# Patient Record
Sex: Male | Born: 1990 | Race: Black or African American | Hispanic: No | Marital: Single | Smoking: Never smoker
Health system: Southern US, Community
[De-identification: ages and names within clinical notes are randomized; demographics above are authoritative.]

## PROBLEM LIST (undated history)

## (undated) DIAGNOSIS — J302 Other seasonal allergic rhinitis: Secondary | ICD-10-CM

## (undated) DIAGNOSIS — K469 Unspecified abdominal hernia without obstruction or gangrene: Secondary | ICD-10-CM

## (undated) DIAGNOSIS — J189 Pneumonia, unspecified organism: Secondary | ICD-10-CM

---
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
## 2017-07-11 ENCOUNTER — Emergency Department (HOSPITAL_COMMUNITY): Payer: Self-pay

## 2017-07-11 ENCOUNTER — Emergency Department (HOSPITAL_COMMUNITY)
Admission: EM | Admit: 2017-07-11 | Discharge: 2017-07-11 | Disposition: A | Discharge status: Home / Self Care | Payer: Self-pay | Attending: Emergency Medicine | Admitting: Emergency Medicine

## 2017-07-11 ENCOUNTER — Encounter (HOSPITAL_COMMUNITY): Payer: Self-pay | Admitting: *Deleted

## 2017-07-11 ENCOUNTER — Other Ambulatory Visit: Payer: Self-pay

## 2017-07-11 DIAGNOSIS — S61213A Laceration without foreign body of left middle finger without damage to nail, initial encounter: Secondary | ICD-10-CM | POA: Insufficient documentation

## 2017-07-11 DIAGNOSIS — Y929 Unspecified place or not applicable: Secondary | ICD-10-CM | POA: Insufficient documentation

## 2017-07-11 DIAGNOSIS — Y939 Activity, unspecified: Secondary | ICD-10-CM | POA: Insufficient documentation

## 2017-07-11 DIAGNOSIS — S61215A Laceration without foreign body of left ring finger without damage to nail, initial encounter: Secondary | ICD-10-CM | POA: Insufficient documentation

## 2017-07-11 DIAGNOSIS — Y999 Unspecified external cause status: Secondary | ICD-10-CM | POA: Insufficient documentation

## 2017-07-11 DIAGNOSIS — W25XXXA Contact with sharp glass, initial encounter: Secondary | ICD-10-CM | POA: Insufficient documentation

## 2017-07-11 DIAGNOSIS — Z23 Encounter for immunization: Secondary | ICD-10-CM | POA: Insufficient documentation

## 2017-07-11 DIAGNOSIS — S61315A Laceration without foreign body of left ring finger with damage to nail, initial encounter: Secondary | ICD-10-CM

## 2017-07-11 MED ORDER — LIDOCAINE HCL (PF) 2 % IJ SOLN
INTRAMUSCULAR | Status: AC
  Filled 2017-07-11: qty 20

## 2017-07-11 MED ORDER — TETANUS-DIPHTH-ACELL PERTUSSIS 5-2.5-18.5 LF-MCG/0.5 IM SUSP
0.5000 mL | Freq: Once | INTRAMUSCULAR | Status: AC
  Administered 2017-07-11: 0.5 mL via INTRAMUSCULAR
  Filled 2017-07-11: qty 0.5

## 2017-07-11 MED ORDER — LIDOCAINE HCL (PF) 2 % IJ SOLN
INTRAMUSCULAR | Status: AC
  Filled 2017-07-11: qty 10

## 2017-07-11 NOTE — ED Triage Notes (Signed)
Pt c/o laceration to middle and ring finger on left hand today from glass of a window, bleeding controlled

## 2017-07-11 NOTE — Discharge Instructions (Addendum)
Keep the finger clean with mild soap and water and keep it bandaged.  Ibuprofen if needed for pain.  Sutures out in 8-10 days.  Call Dr. Romeo AppleHarrison to arrange a follow-up appt if needed.

## 2017-07-11 NOTE — ED Provider Notes (Signed)
  [facility] EMERGENCY DEPARTMENT Provider Note   CSN: [id number] Arrival date & time: 07/11/17  2052     History   Chief Complaint Chief Complaint  Patient presents with  . Laceration    HPI Adam Byrd is a 26 y.o. male.  HPI  Patient presents today complaining of laceration to left third and fourth fingers after putting his hand through a window approximately 6 hours ago.  He denies any other injuries.  He does not know when he had his last tetanus shot.  He denies any other medical problems.  There is no numbness or tingling present.  History reviewed. No pertinent past medical history.  There are no active problems to display for this patient.   History reviewed. No pertinent surgical history.     Home Medications    Prior to Admission medications   Not on File    Family History No family history on file.  Social History Social History   Tobacco Use  . Smoking status: Never Smoker  . Smokeless tobacco: Never Used  Substance Use Topics  . Alcohol use: No    Frequency: Never  . Drug use: No     Allergies   Patient has no known allergies.   Review of Systems Review of Systems  All other systems reviewed and are negative.    Physical Exam Updated Vital Signs BP 121/73   Pulse 78   Temp 98.9 F (37.2 C) (Oral)   Resp 16   Ht 1.6 m (5\' 3" )   Wt 78.5 kg (173 lb)   SpO2 98%   BMI 30.65 kg/m   Physical Exam  Constitutional: He is oriented to person, place, and time. He appears well-developed and well-nourished.  HENT:  Head: Normocephalic and atraumatic.  Musculoskeletal: Normal range of motion.       Hands: 2 cm laceration through the dorsal aspect distal left third digit through the nail .5 cm laceration dorsum left third finger over dip joint Small less than 0.25 laceration dorsal aspect fourth finger over PIP joint Full arom all fingers with 2 point sensation intact  Neurological: He is alert and oriented to person, place,  and time.  Skin: Skin is warm. Capillary refill takes less than 2 seconds.  Psychiatric: He has a normal mood and affect. His behavior is normal.  Nursing note and vitals reviewed.    ED Treatments / Results  Labs (all labs ordered are listed, but only abnormal results are displayed) Labs Reviewed - No data to display  EKG  EKG Interpretation None       Radiology No results found.  Procedures Procedures (including critical care time)  Medications Ordered in ED Medications  lidocaine (XYLOCAINE) 2 % injection (not administered)     Initial Impression / Assessment and Plan / ED Course  I have reviewed the triage vital signs and the nursing notes.  Pertinent labs & imaging results that were available during my care of the patient were reviewed by me and considered in my medical decision making (see chart for details).     Please see repair note per Pauline Ausammy Triplett, PA C  Final Clinical Impressions(s) / ED Diagnoses   Final diagnoses:  Laceration of left ring finger without foreign body with damage to nail, initial encounter    ED Discharge Orders    None       Margarita Grizzleay, Lawson Isabell, MD 07/11/17 2138

## 2017-07-11 NOTE — ED Provider Notes (Signed)
   patient with two lacerations to left middle finger including a nail bed laceration to the distal third of the nail that occurred earlier today.  Bleeding controlled. NV intact and has FROM of the digit.    I was asked by my the attending to repair the lacerations.     LACERATION REPAIR Performed by: Ziomara Birenbaum L. Authorized by: Maxwell CaulRIPLETT,Lanna Labella L. Consent: Verbal consent obtained. Risks and benefits: risks, benefits and alternatives were discussed Consent given by: patient Patient identity confirmed: provided demographic data Prepped and Draped in normal sterile fashion Wound explored  Laceration Location: left distal finger  Laceration Length: 2 cm through the nail bed  No Foreign Bodies seen or palpated  Anesthesia: digital block--- performed by Dr. Rosalia Hammersay  Local anesthetic: lidocaine 2 % w/o epinephrine  Anesthetic total: 2 ml  Irrigation method: syringe Amount of cleaning: standard  Skin closure: 4-0 ethilon  Number of sutures: one interrupted suture through the nail to stabilize, and 3 interrupted sutures to the eponychium  Technique: simple interrupted  Patient tolerance: Patient tolerated the procedure well with no immediate complications.   TD updated.  Finger splinted and bandaged.  Wound care instructions discussed. Agrees to suture removal in 8-10 days.  Return precautions discussed.    Pauline Ausriplett, Renesmay Nesbitt, PA-C 07/11/17 2323    Margarita Grizzleay, Danielle, MD 07/15/17 [phone]

## 2018-10-05 ENCOUNTER — Other Ambulatory Visit: Payer: Self-pay

## 2018-10-05 ENCOUNTER — Emergency Department (HOSPITAL_COMMUNITY)
Admission: EM | Admit: 2018-10-05 | Discharge: 2018-10-05 | Disposition: A | Discharge status: Home / Self Care | Payer: 59 | Attending: Emergency Medicine | Admitting: Emergency Medicine

## 2018-10-05 ENCOUNTER — Emergency Department (HOSPITAL_COMMUNITY): Payer: 59

## 2018-10-05 ENCOUNTER — Encounter (HOSPITAL_COMMUNITY): Payer: Self-pay | Admitting: Emergency Medicine

## 2018-10-05 DIAGNOSIS — R509 Fever, unspecified: Secondary | ICD-10-CM | POA: Diagnosis not present

## 2018-10-05 DIAGNOSIS — R05 Cough: Secondary | ICD-10-CM | POA: Insufficient documentation

## 2018-10-05 DIAGNOSIS — R059 Cough, unspecified: Secondary | ICD-10-CM

## 2018-10-05 LAB — INFLUENZA PANEL BY PCR (TYPE A & B)
Influenza A By PCR: NEGATIVE
Influenza B By PCR: NEGATIVE

## 2018-10-05 MED ORDER — AZITHROMYCIN 250 MG PO TABS: 250.0000 mg | ORAL_TABLET | Freq: Every day | ORAL | 0 refills | Status: DC

## 2018-10-05 MED ORDER — BENZONATATE 100 MG PO CAPS: 100.0000 mg | ORAL_CAPSULE | Freq: Three times a day (TID) | ORAL | 0 refills | Status: DC

## 2018-10-05 MED ORDER — HYDROCOD POLST-CPM POLST ER 10-8 MG/5ML PO SUER
5.0000 mL | Freq: Once | ORAL | Status: AC
  Administered 2018-10-05: 5 mL via ORAL
  Filled 2018-10-05: qty 5

## 2018-10-05 NOTE — ED Notes (Signed)
PT states he will take ibuprofen when he leaves the ED.

## 2018-10-05 NOTE — ED Provider Notes (Signed)
[facility] EMERGENCY DEPARTMENT Provider Note   CSN: [id number] Arrival date & time: 10/05/18  1040     History   Chief Complaint Chief Complaint  Patient presents with  . Influenza    HPI Adam Byrd is a 28 y.o. male into emergency department with chief complaint of cough, fever, headache x1 week.  Patient reports the cough is productive with yellow sputum with associated nasal congestion. He has had 1 episode of posttussive emesis.  He has been taking Tylenol without symptom relief.  His T-max of 101F oral was just prior to arrival. He reports his headache is present after a coughing episode, he currently does not have one. Patient admits multiple sick contacts.  Denies rash, chest pain, shortness of breath, visual changes, neck pain, nausea, diarrhea, urinary symptoms. History reviewed. No pertinent past medical history.  There are no active problems to display for this patient.   History reviewed. No pertinent surgical history.      Home Medications    Prior to Admission medications   Medication Sig Start Date End Date Taking? Authorizing Provider  azithromycin (ZITHROMAX) 250 MG tablet Take 1 tablet (250 mg total) by mouth daily. Take first 2 tablets together, then 1 every day until finished. 10/05/18   Albrizze, Kaitlyn E, PA-C  benzonatate (TESSALON) 100 MG capsule Take 1 capsule (100 mg total) by mouth every 8 (eight) hours. 10/05/18   Albrizze, Caroleen Hamman, PA-C    Family History History reviewed. No pertinent family history.  Social History Social History   Tobacco Use  . Smoking status: Never Smoker  . Smokeless tobacco: Never Used  Substance Use Topics  . Alcohol use: No    Frequency: Never  . Drug use: No     Allergies   Patient has no known allergies.   Review of Systems Review of Systems  Constitutional: Positive for chills and fever.  HENT: Positive for congestion. Negative for ear pain, sinus pain and sore throat.   Respiratory:  Positive for cough. Negative for shortness of breath and wheezing.   Neurological: Positive for headaches. Negative for dizziness.  All other systems reviewed and are negative.    Physical Exam Updated Vital Signs BP 130/62 (BP Location: Right Arm)   Pulse (!) 109   Temp 98.6 F (37 C) (Oral)   Resp 18   Ht 5' 4.5" (1.638 m)   Wt 79.4 kg   SpO2 100%   BMI 29.57 kg/m   Physical Exam Vitals signs and nursing note reviewed.  Constitutional:      Appearance: He is not ill-appearing or toxic-appearing.  HENT:     Head: Normocephalic and atraumatic.     Right Ear: Tympanic membrane normal. There is no impacted cerumen.     Left Ear: Tympanic membrane normal. There is no impacted cerumen.     Nose: Nose normal.     Mouth/Throat:     Mouth: Mucous membranes are moist.     Pharynx: Oropharynx is clear.  Eyes:     General: No scleral icterus.    Conjunctiva/sclera: Conjunctivae normal.  Neck:     Musculoskeletal: Normal range of motion. No muscular tenderness.  Cardiovascular:     Rate and Rhythm: Regular rhythm. Tachycardia present.     Pulses: Normal pulses.     Heart sounds: Normal heart sounds.  Pulmonary:     Effort: Pulmonary effort is normal. No respiratory distress.     Breath sounds: Normal breath sounds. No wheezing, rhonchi or rales.  Abdominal:     General: There is no distension.     Palpations: Abdomen is soft.     Tenderness: There is no abdominal tenderness. There is no guarding or rebound.     Comments: No peritoneal signs.  Musculoskeletal: Normal range of motion.  Skin:    General: Skin is warm and dry.     Capillary Refill: Capillary refill takes less than 2 seconds.  Neurological:     Mental Status: He is alert. Mental status is at baseline.     Motor: No weakness.  Psychiatric:        Behavior: Behavior normal.      ED Treatments / Results  Labs (all labs ordered are listed, but only abnormal results are displayed) Labs Reviewed  INFLUENZA  PANEL BY PCR (TYPE A & B)    EKG None  Radiology Dg Chest 2 View  Result Date: 10/05/2018 CLINICAL DATA:  Hemoptysis. EXAM: CHEST - 2 VIEW COMPARISON:  None. FINDINGS: There is an infiltrate in the left upper lobe. The heart, hila, and mediastinum are normal. No pneumothorax. The lungs are otherwise clear. IMPRESSION: There is infiltrate in the left upper lobe. This could represent pneumonia or aspiration. Recommend treatment with short-term follow-up chest x-ray to ensure resolution. Electronically Signed   By: Gerome Sam III M.D   On: 10/05/2018 12:16    Procedures Procedures (including critical care time)  Medications Ordered in ED Medications  chlorpheniramine-HYDROcodone (TUSSIONEX) 10-8 MG/5ML suspension 5 mL (5 mLs Oral Given 10/05/18 1215)     Initial Impression / Assessment and Plan / ED Course  I have reviewed the triage vital signs and the nursing notes.  Pertinent labs & imaging results that were available during my care of the patient were reviewed by me and considered in my medical decision making (see chart for details).    Patient presents with productive cough, fever, chills x 1 week.  Flu test is negative today.  I viewed his CXR and it shows shows evidence of pneumonia with infiltrate in left upper lobe. Pt afebrile in triage, without  tachypnea or hypotension. His SpO2 is 100% and he is not in respiratory distress.  Will d/c home with  Azithromycin. Conservative therapy indicated. Recommend close follow up with PCP.  Patient is tolerating p.o. fluids, recommend increase fluid intake. Strict return precautions given.   Patient is febrile at discharge.  He declines ibuprofen or Tylenol before leaving, stating he will take some when he gets home. Patient is hemodynamically stable and appears safe for discharge.   Pt case discussed with Dr. Jacqulyn Bath who agrees with my plan.    Final Clinical Impressions(s) / ED Diagnoses   Final diagnoses:  Cough  Fever, unspecified  fever cause    ED Discharge Orders         Ordered    azithromycin (ZITHROMAX) 250 MG tablet  Daily     10/05/18 1301    benzonatate (TESSALON) 100 MG capsule  Every 8 hours     10/05/18 1301           Kathyrn Lass 10/05/18 1955    Maia Plan, MD 10/07/18 [phone]

## 2018-10-05 NOTE — ED Triage Notes (Signed)
Pt c/o of productive cough, chills, fever, headache x 1 week.

## 2018-10-05 NOTE — Discharge Instructions (Signed)
You have been seen today for fever and cough. Please read and follow all provided instructions. Return to the emergency room for worsening condition or new concerning symptoms.     You should follow-up with your primary care provider in 4 weeks, at that time they might repeat your chest x-ray if they feel it is necessary.   1. Medications:  Prescription sent to your pharmacy for Azithromycin. Please take antibiotics as prescribed.  Also sent a prescription for Tessalon. This is a cough medicine you can take as needed.  Continue usual home medications Take medications as prescribed. Please review all of the medicines and only take them if you do not have an allergy to them.  2. Treatment: rest, drink plenty of fluids 3. Follow Up: Please follow up with your primary doctor in 2-5 days for discussion of your diagnoses and further evaluation after today's visit; Call today to arrange your follow up.  If you do not have a primary care doctor use the resource guide provided to find one;   It is also a possibility that you have an allergic reaction to any of the medicines that you have been prescribed - Everybody reacts differently to medications and while MOST people have no trouble with most medicines, you may have a reaction such as nausea, vomiting, rash, swelling, shortness of breath. If this is the case, please stop taking the medicine immediately and contact your physician.  ?

## 2018-10-27 ENCOUNTER — Other Ambulatory Visit: Payer: Self-pay

## 2018-10-27 ENCOUNTER — Emergency Department (HOSPITAL_COMMUNITY)
Admission: EM | Admit: 2018-10-27 | Discharge: 2018-10-28 | Disposition: A | Discharge status: Home / Self Care | Payer: 59 | Attending: Emergency Medicine | Admitting: Emergency Medicine

## 2018-10-27 ENCOUNTER — Encounter (HOSPITAL_COMMUNITY): Payer: Self-pay | Admitting: Emergency Medicine

## 2018-10-27 DIAGNOSIS — K429 Umbilical hernia without obstruction or gangrene: Secondary | ICD-10-CM | POA: Diagnosis not present

## 2018-10-27 DIAGNOSIS — R1033 Periumbilical pain: Secondary | ICD-10-CM | POA: Diagnosis present

## 2018-10-27 HISTORY — DX: Pneumonia, unspecified organism: J18.9

## 2018-10-27 NOTE — ED Triage Notes (Signed)
Pt with pain around umbilicus that started 5 days ago. States worse after eating. Pt's girlfriend believes he has a hernia.

## 2018-10-27 NOTE — ED Provider Notes (Signed)
[facility] EMERGENCY DEPARTMENT Provider Note   CSN: [id number] Arrival date & time: 10/27/18  2132    History   Chief Complaint Chief Complaint  Patient presents with  . Abdominal Pain    HPI Adam Byrd is a 28 y.o. male.      HPI  Adam Byrd is a 28 y.o. male who presents to the Emergency Department complaining of waxing and waning umbilical pain for 4 to 5 days.  This evening, he states he was eating dinner and developed worsening pain around his umbilicus.  He states that the pain typically worsens after eating heavy meals or lifting heavy objects.  No fever, chills, vomiting or diarrhea.  He endorses regular bowel movements.  He states that he believes he has a hernia.  He denies pain at present and came to the ER under the advisement of his girlfriend.      Past Medical History:  Diagnosis Date  . Pneumonia     There are no active problems to display for this patient.   History reviewed. No pertinent surgical history.    Home Medications    Prior to Admission medications   Medication Sig Start Date End Date Taking? Authorizing Provider  azithromycin (ZITHROMAX) 250 MG tablet Take 1 tablet (250 mg total) by mouth daily. Take first 2 tablets together, then 1 every day until finished. 10/05/18   Albrizze, Kaitlyn E, PA-C  benzonatate (TESSALON) 100 MG capsule Take 1 capsule (100 mg total) by mouth every 8 (eight) hours. 10/05/18   Albrizze, Caroleen Hamman, PA-C    Family History No family history on file.  Social History Social History   Tobacco Use  . Smoking status: Never Smoker  . Smokeless tobacco: Never Used  Substance Use Topics  . Alcohol use: No    Frequency: Never  . Drug use: No     Allergies   Patient has no known allergies.   Review of Systems Review of Systems  Constitutional: Negative for appetite change, chills and fever.  Respiratory: Negative for shortness of breath.   Cardiovascular: Negative for chest pain.    Gastrointestinal: Positive for abdominal pain. Negative for blood in stool, diarrhea, nausea and vomiting.  Musculoskeletal: Negative for arthralgias and back pain.  Skin: Negative for color change and rash.  Neurological: Negative for dizziness, weakness and numbness.  Hematological: Negative for adenopathy.     Physical Exam Updated Vital Signs BP 131/67 (BP Location: Right Arm)   Pulse 92   Temp 98.1 F (36.7 C) (Oral)   Resp 18   Ht 5\' 4"  (1.626 m)   Wt 72.6 kg   SpO2 95%   BMI 27.46 kg/m   Physical Exam Vitals signs and nursing note reviewed.  Constitutional:      General: He is not in acute distress.    Appearance: He is well-developed. He is not ill-appearing.     Comments: Pt is sleeping, easily awakens  HENT:     Mouth/Throat:     Mouth: Mucous membranes are moist.     Pharynx: Oropharynx is clear.  Neck:     Musculoskeletal: Normal range of motion.  Cardiovascular:     Rate and Rhythm: Normal rate and regular rhythm.     Pulses: Normal pulses.  Pulmonary:     Effort: Pulmonary effort is normal.     Breath sounds: Normal breath sounds.  Abdominal:     General: Bowel sounds are normal. There is no distension.  Palpations: Abdomen is soft.     Tenderness: There is no abdominal tenderness. There is no guarding. Negative signs include McBurney's sign.     Hernia: A hernia is present. Hernia is present in the umbilical area.     Comments: Small palpable umbilical hernia that spontaneously reduces when supine.  No erythema, abdomen is soft and non-tender on exam.    Musculoskeletal: Normal range of motion.  Skin:    General: Skin is warm.  Neurological:     General: No focal deficit present.      ED Treatments / Results  Labs (all labs ordered are listed, but only abnormal results are displayed) Labs Reviewed - No data to display  EKG None  Radiology No results found.  Procedures Procedures (including critical care time)  Medications Ordered  in ED Medications - No data to display   Initial Impression / Assessment and Plan / ED Course  I have reviewed the triage vital signs and the nursing notes.  Pertinent labs & imaging results that were available during my care of the patient were reviewed by me and considered in my medical decision making (see chart for details).        Pt with 4-5 day hx of waxing and waning abdominal pain, small umbilical hernia on exam that spontaneously reduces.  No concerning sx for acute surgical abdomen.  Pt denies pain during my exam and he is asleep when I entered the room.  Vitals are reassuring.  No hx of vomiting or fever. patient and significant other are wanting to leave since he is pain free.   I feel he is appropriate for d/c home and will be provided with out pt referral info for general surgery.  Strict return precautions also discussed.   Final Clinical Impressions(s) / ED Diagnoses   Final diagnoses:  Umbilical hernia without obstruction and without gangrene    ED Discharge Orders    None       Pauline Aus, PA-C 10/28/18 0009    Devoria Albe, MD 10/28/18 [phone]

## 2018-10-28 NOTE — Discharge Instructions (Addendum)
You likely have an umbilical hernia.  Call the general surgeon listed to arrange a follow-up appt.  Return to ER for any increasing pain, fever or vomiting.

## 2019-03-04 ENCOUNTER — Emergency Department (HOSPITAL_COMMUNITY)
Admission: EM | Admit: 2019-03-04 | Discharge: 2019-03-04 | Disposition: A | Discharge status: Home / Self Care | Payer: 59 | Attending: Emergency Medicine | Admitting: Emergency Medicine

## 2019-03-04 ENCOUNTER — Other Ambulatory Visit: Payer: Self-pay

## 2019-03-04 ENCOUNTER — Encounter (HOSPITAL_COMMUNITY): Payer: Self-pay

## 2019-03-04 DIAGNOSIS — K429 Umbilical hernia without obstruction or gangrene: Secondary | ICD-10-CM | POA: Diagnosis not present

## 2019-03-04 DIAGNOSIS — R1033 Periumbilical pain: Secondary | ICD-10-CM | POA: Diagnosis present

## 2019-03-04 HISTORY — DX: Unspecified abdominal hernia without obstruction or gangrene: K46.9

## 2019-03-04 NOTE — ED Provider Notes (Signed)
[facility] EMERGENCY DEPARTMENT Provider Note   CSN: [id number] Arrival date & time: 03/04/19  1259    History   Chief Complaint Chief Complaint  Patient presents with  . Hernia    HPI Corena Herteryrone J Friedlander is a 28 y.o. male with past medical history significant for abdominal wall hernia who presents for evaluation of hernia.  Patient states he was diagnosed with this approximately 4 months ago.  Patient states he supposed to follow-up with general surgery however has not done this.  Patient states he bent down to pick something up earlier today and developed periumbilical pain.  Patient states he noticed a bulge just below his umbilicus at that time.  Patient states this resolved while he was in the waiting room prior to my evaluation.  He has no current pain.  Denies fever, chills, nausea, vomiting, chest pain, shortness of breath, abdominal pain, diarrhea, dysuria.  Denies additional aggravating or alleviating factors.  Is not taking anything for symptoms.  History obtained from patient and past medical records.  No interpreter is used.    HPI  Past Medical History:  Diagnosis Date  . Abdominal hernia   . Pneumonia     There are no active problems to display for this patient.   History reviewed. No pertinent surgical history.    Home Medications    Prior to Admission medications   Not on File    Family History No family history on file.  Social History Social History   Tobacco Use  . Smoking status: Never Smoker  . Smokeless tobacco: Never Used  Substance Use Topics  . Alcohol use: No    Frequency: Never  . Drug use: No     Allergies   Patient has no known allergies.   Review of Systems Review of Systems  Constitutional: Negative.   HENT: Negative.   Respiratory: Negative.   Cardiovascular: Negative.   Gastrointestinal: Positive for abdominal pain (prior to arrival, none currently). Negative for abdominal distention, anal bleeding, blood in stool,  constipation, diarrhea, nausea and vomiting.  Genitourinary: Negative.   Musculoskeletal: Negative.   Skin: Negative.   Neurological: Negative.   All other systems reviewed and are negative.  Physical Exam Updated Vital Signs BP 96/82 (BP Location: Right Arm)   Pulse 84   Temp 98.5 F (36.9 C) (Oral)   Resp 16   Ht 5\' 5"  (1.651 m)   Wt 78.5 kg   BMI 28.79 kg/m   Physical Exam Vitals signs and nursing note reviewed. Exam conducted with a chaperone present.  Constitutional:      General: He is not in acute distress.    Appearance: He is well-developed. He is not ill-appearing, toxic-appearing or diaphoretic.  HENT:     Head: Normocephalic and atraumatic.     Nose: Nose normal.     Mouth/Throat:     Mouth: Mucous membranes are moist.     Pharynx: Oropharynx is clear.  Eyes:     Pupils: Pupils are equal, round, and reactive to light.  Neck:     Musculoskeletal: Normal range of motion and neck supple.  Cardiovascular:     Rate and Rhythm: Normal rate and regular rhythm.     Pulses: Normal pulses.     Heart sounds: Normal heart sounds. No murmur. No friction rub. No gallop.   Pulmonary:     Effort: Pulmonary effort is normal. No respiratory distress.     Breath sounds: Normal breath sounds. No stridor. No wheezing, rhonchi  or rales.  Chest:     Chest wall: No tenderness.  Abdominal:     General: There is no distension.     Palpations: Abdomen is soft.     Hernia: There is no hernia in the left inguinal area or right inguinal area.     Comments: Soft, nontender without rebound or guarding.  Patient with small palpable periumbilical hernia which reduces with supine.  No evidence of strangulated or incarcerated hernia.  Negative CVA tap bilaterally. No overlying skin changes to abdominal wall.  Genitourinary:    Penis: Normal.      Scrotum/Testes: Normal. Cremasteric reflex is present.     Epididymis:     Right: Normal.     Left: Normal.  Musculoskeletal: Normal range of  motion.  Lymphadenopathy:     Lower Body: No right inguinal adenopathy. No left inguinal adenopathy.  Skin:    General: Skin is warm and dry.  Neurological:     Mental Status: He is alert.    ED Treatments / Results  Labs (all labs ordered are listed, but only abnormal results are displayed) Labs Reviewed - No data to display  EKG None  Radiology No results found.  Procedures Procedures (including critical care time)  Medications Ordered in ED Medications - No data to display  Initial Impression / Assessment and Plan / ED Course  I have reviewed the triage vital signs and the nursing notes.  Pertinent labs & imaging results that were available during my care of the patient were reviewed by me and considered in my medical decision making (see chart for details).  28 year old male appears otherwise well presents for evaluation of hernia pain.  He is afebrile, nonseptic, non-ill-appearing.  Patient with small, palpable periumbilical hernia which reduces when he is supine.  No evidence of strangulated or incarcerated hernia.  Abdomen soft, nontender without rebound or guarding.  He has no evidence of acute surgical abdomen.  Patient is sleeping when I first enter the room.  Arousable to voice.  He has no tachycardia, tachypnea or hypoxia.  He is tolerating p.o. intake without difficulty.  No current pain.  Patient is nontoxic, nonseptic appearing, in no apparent distress.  Patient's pain and other symptoms adequately managed in emergency department.  Fluid bolus given.  Labs, imaging and vitals reviewed.  Patient does not meet the SIRS or Sepsis criteria.  On repeat exam patient does not have a surgical abdomin and there are no peritoneal signs.  No indication of appendicitis, bowel obstruction, bowel perforation, cholecystitis, diverticulitis.  Patient discharged home with symptomatic treatment and given strict instructions for follow-up with the general surgery.  The patient has been  appropriately medically screened and/or stabilized in the ED. I have low suspicion for any other emergent medical condition which would require further screening, evaluation or treatment in the ED or require inpatient management.  Patient is hemodynamically stable and in no acute distress.  Patient able to ambulate in department prior to ED.  Evaluation does not show acute pathology that would require ongoing or additional emergent interventions while in the emergency department or further inpatient treatment.  I have discussed the diagnosis with the patient and answered all questions.  Pain is been managed while in the emergency department and patient has no further complaints prior to discharge.  Patient is comfortable with plan discussed in room and is stable for discharge at this time.  I have discussed strict return precautions for returning to the emergency department.  Patient was encouraged to follow-up with PCP/specialist refer to at discharge.     Final Clinical Impressions(s) / ED Diagnoses   Final diagnoses:  Umbilical hernia without obstruction and without gangrene    ED Discharge Orders    None       Jaquez Farrington A, PA-C 03/04/19 1627    Fredia Sorrow, MD 03/04/19 1803

## 2019-03-04 NOTE — ED Triage Notes (Signed)
Pt reports that his abdominal hernia began hurting again today while at work. He came in several months ago but never followed up because pain went away.

## 2019-03-04 NOTE — Discharge Instructions (Addendum)
Follow-up with general surgery for your umbilical hernia.  Return to the ED for any new worsening symptoms.

## 2019-04-21 ENCOUNTER — Ambulatory Visit: Payer: Self-pay | Admitting: General Surgery

## 2019-05-19 ENCOUNTER — Ambulatory Visit (INDEPENDENT_AMBULATORY_CARE_PROVIDER_SITE_OTHER): Payer: 59 | Admitting: General Surgery

## 2019-05-19 ENCOUNTER — Encounter: Payer: Self-pay | Admitting: General Surgery

## 2019-05-19 ENCOUNTER — Other Ambulatory Visit: Payer: Self-pay

## 2019-05-19 VITALS — BP 142/76 | HR 71 | Temp 97.8°F | Resp 16 | Ht 65.0 in | Wt 175.0 lb

## 2019-05-19 DIAGNOSIS — K429 Umbilical hernia without obstruction or gangrene: Secondary | ICD-10-CM | POA: Diagnosis not present

## 2019-05-19 NOTE — Patient Instructions (Signed)
Ventral Hernia  A ventral hernia is a bulge of tissue from inside the abdomen that pushes through a weak area of the muscles that form the front wall of the abdomen. The tissues inside the abdomen are inside a sac (peritoneum). These tissues include the small intestine, large intestine, and the fatty tissue that covers the intestines (omentum). Sometimes, the bulge that forms a hernia contains intestines. Other hernias contain only fat. Ventral hernias do not go away without surgical treatment. There are several types of ventral hernias. You may have:  A hernia at an incision site from previous abdominal surgery (incisional hernia).  A hernia just above the belly button (epigastric hernia), or at the belly button (umbilical hernia). These types of hernias can develop from heavy lifting or straining.  A hernia that comes and goes (reducible hernia). It may be visible only when you lift or strain. This type of hernia can be pushed back into the abdomen (reduced).  A hernia that traps abdominal tissue inside the hernia (incarcerated hernia). This type of hernia does not reduce.  A hernia that cuts off blood flow to the tissues inside the hernia (strangulated hernia). The tissues can start to die if this happens. This is a very painful bulge that cannot be reduced. A strangulated hernia is a medical emergency. What are the causes? This condition is caused by abdominal tissue putting pressure on an area of weakness in the abdominal muscles. What increases the risk? The following factors may make you more likely to develop this condition:  Being male.  Being 60 or older.  Being overweight or obese.  Having had previous abdominal surgery, especially if there was an infection after surgery.  Having had an injury to the abdominal wall.  Having had several pregnancies.  Having a buildup of fluid inside the abdomen (ascites). What are the signs or symptoms? The only symptom of a ventral hernia  may be a painless bulge in the abdomen. A reducible hernia may be visible only when you strain, cough, or lift. Other symptoms may include:  Dull pain.  A feeling of pressure. Signs and symptoms of a strangulated hernia may include:  Increasing pain.  Nausea and vomiting.  Pain when pressing on the hernia.  The skin over the hernia turning red or purple.  Constipation.  Blood in the stool (feces). How is this diagnosed? This condition may be diagnosed based on:  Your symptoms.  Your medical history.  A physical exam. You may be asked to cough or strain while standing. These actions increase the pressure inside your abdomen and force the hernia through the opening in your muscles. Your health care provider may try to reduce the hernia by pressing on it.  Imaging studies, such as an ultrasound or CT scan. How is this treated? This condition is treated with surgery. If you have a strangulated hernia, surgery is done as soon as possible. If your hernia is small and not incarcerated, you may be asked to lose some weight before surgery. Follow these instructions at home:  Follow instructions from your health care provider about eating or drinking restrictions.  If you are overweight, your health care provider may recommend that you increase your activity level and eat a healthier diet.  Do not lift anything that is heavier than 10 lb (4.5 kg).  Return to your normal activities as told by your health care provider. Ask your health care provider what activities are safe for you. You may need to avoid activities   that increase pressure on your hernia.  Take over-the-counter and prescription medicines only as told by your health care provider.  Keep all follow-up visits as told by your health care provider. This is important. Contact a health care provider if:  Your hernia gets larger.  Your hernia becomes painful. Get help right away if:  Your hernia becomes increasingly  painful.  You have pain along with any of the following: ? Changes in skin color in the area of the hernia. ? Nausea. ? Vomiting. ? Fever. Summary  A ventral hernia is a bulge of tissue from inside the abdomen that pushes through a weak area of the muscles that form the front wall of the abdomen.  This condition is treated with surgery, which may be urgent depending on your hernia.  Do not lift anything that is heavier than 10 lb (4.5 kg), and follow activity instructions from your health care provider. This information is not intended to replace advice given to you by your health care provider. Make sure you discuss any questions you have with your health care provider. Document Released: 07/23/2012 Document Revised: 09/18/2017 Document Reviewed: 02/25/2017 Elsevier Patient Education  2020 [employer]  

## 2019-05-19 NOTE — Progress Notes (Signed)
Adam Byrd; [id number]; 05/20/1991   HPI Patient is a 28-year-old black male who was seen in the emergency room for an umbilical hernia.  He was referred to my care for further evaluation and treatment.  Patient states he has had an umbilical hernia for over a year.  It recently increased in size and caused him discomfort.  It was reduced in the emergency room.  He states the hernia recurs when he is straining.  It is uncomfortable when it is sticking out.  No nausea or vomiting have been noted.  He lifts up to 40 pounds at work.  He currently has 0 out of 10 abdominal pain. Past Medical History:  Diagnosis Date  . Abdominal hernia   . Pneumonia     No past surgical history on file.  No family history on file.  No current outpatient medications on file prior to visit.   No current facility-administered medications on file prior to visit.     No Known Allergies  Social History   Substance and Sexual Activity  Alcohol Use No  . Frequency: Never    Social History   Tobacco Use  Smoking Status Never Smoker  Smokeless Tobacco Never Used    Review of Systems  Constitutional: Negative.   HENT: Negative.   Eyes: Negative.   Respiratory: Negative.   Cardiovascular: Negative.   Gastrointestinal: Negative.   Genitourinary: Negative.   Musculoskeletal: Negative.   Skin: Negative.   Neurological: Negative.   Endo/Heme/Allergies: Negative.   Psychiatric/Behavioral: Negative.     Objective   Vitals:   05/19/19 1543  BP: (!) 142/76  Pulse: 71  Resp: 16  Temp: 97.8 F (36.6 C)  SpO2: 98%    Physical Exam Vitals signs reviewed.  Constitutional:      Appearance: Normal appearance. He is normal weight. He is not ill-appearing.  HENT:     Head: Normocephalic and atraumatic.  Cardiovascular:     Rate and Rhythm: Normal rate and regular rhythm.     Heart sounds: Normal heart sounds. No murmur. No friction rub. No gallop.   Pulmonary:     Effort: Pulmonary  effort is normal. No respiratory distress.     Breath sounds: Normal breath sounds. No stridor. No wheezing, rhonchi or rales.  Abdominal:     General: Abdomen is flat. Bowel sounds are normal. There is no distension.     Palpations: There is no mass.     Tenderness: There is no abdominal tenderness. There is no guarding.     Hernia: No hernia is present.     Comments: Reducible umbilical hernia present.  Skin:    General: Skin is warm and dry.  Neurological:     Mental Status: He is alert and oriented to person, place, and time.    ER notes reviewed Assessment  Umbilical hernia, without obstruction Plan   Patient will call to schedule umbilical herniorrhaphy with mesh.  The risks and benefits of the procedure including bleeding, infection, mesh use, the possibility of recurrence of the hernia were fully explained to the patient, who gave informed consent.  

## 2019-05-27 NOTE — H&P (Signed)
Adam Byrd; [id number]; 06-27-1991   HPI Patient is a 28 year old black male who was seen in the emergency room for an umbilical hernia.  He was referred to my care for further evaluation and treatment.  Patient states he has had an umbilical hernia for over a year.  It recently increased in size and caused him discomfort.  It was reduced in the emergency room.  He states the hernia recurs when he is straining.  It is uncomfortable when it is sticking out.  No nausea or vomiting have been noted.  He lifts up to 40 pounds at work.  He currently has 0 out of 10 abdominal pain. Past Medical History:  Diagnosis Date  . Abdominal hernia   . Pneumonia     No past surgical history on file.  No family history on file.  No current outpatient medications on file prior to visit.   No current facility-administered medications on file prior to visit.     No Known Allergies  Social History   Substance and Sexual Activity  Alcohol Use No  . Frequency: Never    Social History   Tobacco Use  Smoking Status Never Smoker  Smokeless Tobacco Never Used    Review of Systems  Constitutional: Negative.   HENT: Negative.   Eyes: Negative.   Respiratory: Negative.   Cardiovascular: Negative.   Gastrointestinal: Negative.   Genitourinary: Negative.   Musculoskeletal: Negative.   Skin: Negative.   Neurological: Negative.   Endo/Heme/Allergies: Negative.   Psychiatric/Behavioral: Negative.     Objective   Vitals:   05/19/19 1543  BP: (!) 142/76  Pulse: 71  Resp: 16  Temp: 97.8 F (36.6 C)  SpO2: 98%    Physical Exam Vitals signs reviewed.  Constitutional:      Appearance: Normal appearance. He is normal weight. He is not ill-appearing.  HENT:     Head: Normocephalic and atraumatic.  Cardiovascular:     Rate and Rhythm: Normal rate and regular rhythm.     Heart sounds: Normal heart sounds. No murmur. No friction rub. No gallop.   Pulmonary:     Effort: Pulmonary  effort is normal. No respiratory distress.     Breath sounds: Normal breath sounds. No stridor. No wheezing, rhonchi or rales.  Abdominal:     General: Abdomen is flat. Bowel sounds are normal. There is no distension.     Palpations: There is no mass.     Tenderness: There is no abdominal tenderness. There is no guarding.     Hernia: No hernia is present.     Comments: Reducible umbilical hernia present.  Skin:    General: Skin is warm and dry.  Neurological:     Mental Status: He is alert and oriented to person, place, and time.    ER notes reviewed Assessment  Umbilical hernia, without obstruction Plan   Patient will call to schedule umbilical herniorrhaphy with mesh.  The risks and benefits of the procedure including bleeding, infection, mesh use, the possibility of recurrence of the hernia were fully explained to the patient, who gave informed consent.

## 2019-06-08 ENCOUNTER — Other Ambulatory Visit (HOSPITAL_COMMUNITY)
Admission: RE | Admit: 2019-06-08 | Discharge: 2019-06-08 | Disposition: A | Discharge status: Home / Self Care | Payer: 59 | Source: Ambulatory Visit | Attending: General Surgery | Admitting: General Surgery

## 2019-06-08 ENCOUNTER — Encounter (HOSPITAL_COMMUNITY): Payer: Self-pay

## 2019-06-08 ENCOUNTER — Other Ambulatory Visit: Payer: Self-pay

## 2019-06-08 ENCOUNTER — Encounter (HOSPITAL_COMMUNITY)
Admission: RE | Admit: 2019-06-08 | Discharge: 2019-06-08 | Disposition: A | Discharge status: Home / Self Care | Payer: 59 | Source: Ambulatory Visit | Attending: General Surgery | Admitting: General Surgery

## 2019-06-08 DIAGNOSIS — Z20828 Contact with and (suspected) exposure to other viral communicable diseases: Secondary | ICD-10-CM | POA: Insufficient documentation

## 2019-06-08 DIAGNOSIS — Z01812 Encounter for preprocedural laboratory examination: Secondary | ICD-10-CM | POA: Insufficient documentation

## 2019-06-08 LAB — SARS CORONAVIRUS 2 (TAT 6-24 HRS): SARS Coronavirus 2: NEGATIVE

## 2019-06-08 NOTE — Patient Instructions (Signed)
Adam Byrd  06/08/2019     @PREFPERIOPPHARMACY @   Your procedure is scheduled on  06/10/2019 .  Report to [facility] at  0700  A.M.  Call this number if you have problems the morning of surgery:  [phone]   Remember:  Do not eat or drink after midnight.                       Take these medicines the morning of surgery with A SIP OF WATER None    Do not wear jewelry, make-up or nail polish.  Do not wear lotions, powders, or perfumes. Please wear deodorant and brush your teeth.  Do not shave 48 hours prior to surgery.  Men may shave face and neck.  Do not bring valuables to the hospital.  [facility] is not responsible for any belongings or valuables.  Contacts, dentures or bridgework may not be worn into surgery.  Leave your suitcase in the car.  After surgery it may be brought to your room.  For patients admitted to the hospital, discharge time will be determined by your treatment team.  Patients discharged the day of surgery will not be allowed to drive home.   Name and phone number of your driver:  family Special instructions:  None  Please read over the following fact sheets that you were given. Anesthesia Post-op Instructions and Care and Recovery After Surgery       Open Hernia Repair, Adult, Care After These instructions give you information about caring for yourself after your procedure. Your doctor may also give you more specific instructions. If you have problems or questions, contact your doctor. Follow these instructions at home: Surgical cut (incision) care   Follow instructions from your doctor about how to take care of your surgical cut area. Make sure you: ? Wash your hands with soap and water before you change your bandage (dressing). If you cannot use soap and water, use hand sanitizer. ? Change your bandage as told by your doctor. ? Leave stitches (sutures), skin glue, or skin tape (adhesive) strips in place. They may need  to stay in place for 2 weeks or longer. If tape strips get loose and curl up, you may trim the loose edges. Do not remove tape strips completely unless your doctor says it is okay.  Check your surgical cut every day for signs of infection. Check for: ? More redness, swelling, or pain. ? More fluid or blood. ? Warmth. ? Pus or a bad smell. Activity  Do not drive or use heavy machinery while taking prescription pain medicine. Do not drive until your doctor says it is okay.  Until your doctor says it is okay: ? Do not lift anything that is heavier than 10 lb (4.5 kg). ? Do not play contact sports.  Return to your normal activities as told by your doctor. Ask your doctor what activities are safe. General instructions  To prevent or treat having a hard time pooping (constipation) while you are taking prescription pain medicine, your doctor may recommend that you: ? Drink enough fluid to keep your pee (urine) clear or pale yellow. ? Take over-the-counter or prescription medicines. ? Eat foods that are high in fiber, such as fresh fruits and vegetables, whole grains, and beans. ? Limit foods that are high in fat and processed sugars, such as fried and sweet foods.  Take over-the-counter and prescription medicines only as told  by your doctor.  Do not take baths, swim, or use a hot tub until your doctor says it is okay.  Keep all follow-up visits as told by your doctor. This is important. Contact a doctor if:  You develop a rash.  You have more redness, swelling, or pain around your surgical cut.  You have more fluid or blood coming from your surgical cut.  Your surgical cut feels warm to the touch.  You have pus or a bad smell coming from your surgical cut.  You have a fever or chills.  You have blood in your poop (stool).  You have not pooped in 2-3 days.  Medicine does not help your pain. Get help right away if:  You have chest pain or you are short of breath.  You feel  light-headed.  You feel weak and dizzy (feel faint).  You have very bad pain.  You throw up (vomit) and your pain is worse. This information is not intended to replace advice given to you by your health care provider. Make sure you discuss any questions you have with your health care provider. Document Released: 08/27/2014 Document Revised: 11/28/2018 Document Reviewed: 01/18/2016 Elsevier Patient Education  2020 [place] Anesthesia, Adult, Care After This sheet gives you information about how to care for yourself after your procedure. Your health care provider may also give you more specific instructions. If you have problems or questions, contact your health care provider. What can I expect after the procedure? After the procedure, the following side effects are common:  Pain or discomfort at the IV site.  Nausea.  Vomiting.  Sore throat.  Trouble concentrating.  Feeling cold or chills.  Weak or tired.  Sleepiness and fatigue.  Soreness and body aches. These side effects can affect parts of the body that were not involved in surgery. Follow these instructions at home:  For at least 24 hours after the procedure:  Have a responsible adult stay with you. It is important to have someone help care for you until you are awake and alert.  Rest as needed.  Do not: ? Participate in activities in which you could fall or become injured. ? Drive. ? Use heavy machinery. ? Drink alcohol. ? Take sleeping pills or medicines that cause drowsiness. ? Make important decisions or sign legal documents. ? Take care of children on your own. Eating and drinking  Follow any instructions from your health care provider about eating or drinking restrictions.  When you feel hungry, start by eating small amounts of foods that are soft and easy to digest (bland), such as toast. Gradually return to your regular diet.  Drink enough fluid to keep your urine pale yellow.  If  you vomit, rehydrate by drinking water, juice, or clear broth. General instructions  If you have sleep apnea, surgery and certain medicines can increase your risk for breathing problems. Follow instructions from your health care provider about wearing your sleep device: ? Anytime you are sleeping, including during daytime naps. ? While taking prescription pain medicines, sleeping medicines, or medicines that make you drowsy.  Return to your normal activities as told by your health care provider. Ask your health care provider what activities are safe for you.  Take over-the-counter and prescription medicines only as told by your health care provider.  If you smoke, do not smoke without supervision.  Keep all follow-up visits as told by your health care provider. This is important. Contact a health care provider if:  You have nausea or vomiting that does not get better with medicine.  You cannot eat or drink without vomiting.  You have pain that does not get better with medicine.  You are unable to pass urine.  You develop a skin rash.  You have a fever.  You have redness around your IV site that gets worse. Get help right away if:  You have difficulty breathing.  You have chest pain.  You have blood in your urine or stool, or you vomit blood. Summary  After the procedure, it is common to have a sore throat or nausea. It is also common to feel tired.  Have a responsible adult stay with you for the first 24 hours after general anesthesia. It is important to have someone help care for you until you are awake and alert.  When you feel hungry, start by eating small amounts of foods that are soft and easy to digest (bland), such as toast. Gradually return to your regular diet.  Drink enough fluid to keep your urine pale yellow.  Return to your normal activities as told by your health care provider. Ask your health care provider what activities are safe for you. This  information is not intended to replace advice given to you by your health care provider. Make sure you discuss any questions you have with your health care provider. Document Released: 11/12/2000 Document Revised: 08/09/2017 Document Reviewed: 03/22/2017 Elsevier Patient Education  2020 [employer]. How to Use Chlorhexidine for Bathing Chlorhexidine gluconate (CHG) is a germ-killing (antiseptic) solution that is used to clean the skin. It can get rid of the bacteria that normally live on the skin and can keep them away for about 24 hours. To clean your skin with CHG, you may be given:  A CHG solution to use in the shower or as part of a sponge bath.  A prepackaged cloth that contains CHG. Cleaning your skin with CHG may help lower the risk for infection:  While you are staying in the intensive care unit of the hospital.  If you have a vascular access, such as a central line, to provide short-term or long-term access to your veins.  If you have a catheter to drain urine from your bladder.  If you are on a ventilator. A ventilator is a machine that helps you breathe by moving air in and out of your lungs.  After surgery. What are the risks? Risks of using CHG include:  A skin reaction.  Hearing loss, if CHG gets in your ears.  Eye injury, if CHG gets in your eyes and is not rinsed out.  The CHG product catching fire. Make sure that you avoid smoking and flames after applying CHG to your skin. Do not use CHG:  If you have a chlorhexidine allergy or have previously reacted to chlorhexidine.  On babies younger than 63 months of age. How to use CHG solution  Use CHG only as told by your health care provider, and follow the instructions on the label.  Use the full amount of CHG as directed. Usually, this is one bottle. During a shower Follow these steps when using CHG solution during a shower (unless your health care provider gives you different instructions): 1. Start the  shower. 2. Use your normal soap and shampoo to wash your face and hair. 3. Turn off the shower or move out of the shower stream. 4. Pour the CHG onto a clean washcloth. Do not use any type of brush or  rough-edged sponge. 5. Starting at your neck, lather your body down to your toes. Make sure you follow these instructions: ? If you will be having surgery, pay special attention to the part of your body where you will be having surgery. Scrub this area for at least 1 minute. ? Do not use CHG on your head or face. If the solution gets into your ears or eyes, rinse them well with water. ? Avoid your genital area. ? Avoid any areas of skin that have broken skin, cuts, or scrapes. ? Scrub your back and under your arms. Make sure to wash skin folds. 6. Let the lather sit on your skin for 1-2 minutes or as long as told by your health care provider. 7. Thoroughly rinse your entire body in the shower. Make sure that all body creases and crevices are rinsed well. 8. Dry off with a clean towel. Do not put any substances on your body afterward-such as powder, lotion, or perfume-unless you are told to do so by your health care provider. Only use lotions that are recommended by the manufacturer. 9. Put on clean clothes or pajamas. 10. If it is the night before your surgery, sleep in clean sheets.  During a sponge bath Follow these steps when using CHG solution during a sponge bath (unless your health care provider gives you different instructions): 1. Use your normal soap and shampoo to wash your face and hair. 2. Pour the CHG onto a clean washcloth. 3. Starting at your neck, lather your body down to your toes. Make sure you follow these instructions: ? If you will be having surgery, pay special attention to the part of your body where you will be having surgery. Scrub this area for at least 1 minute. ? Do not use CHG on your head or face. If the solution gets into your ears or eyes, rinse them well with  water. ? Avoid your genital area. ? Avoid any areas of skin that have broken skin, cuts, or scrapes. ? Scrub your back and under your arms. Make sure to wash skin folds. 4. Let the lather sit on your skin for 1-2 minutes or as long as told by your health care provider. 5. Using a different clean, wet washcloth, thoroughly rinse your entire body. Make sure that all body creases and crevices are rinsed well. 6. Dry off with a clean towel. Do not put any substances on your body afterward-such as powder, lotion, or perfume-unless you are told to do so by your health care provider. Only use lotions that are recommended by the manufacturer. 7. Put on clean clothes or pajamas. 8. If it is the night before your surgery, sleep in clean sheets. How to use CHG prepackaged cloths  Only use CHG cloths as told by your health care provider, and follow the instructions on the label.  Use the CHG cloth on clean, dry skin.  Do not use the CHG cloth on your head or face unless your health care provider tells you to.  When washing with the CHG cloth: ? Avoid your genital area. ? Avoid any areas of skin that have broken skin, cuts, or scrapes. Before surgery Follow these steps when using a CHG cloth to clean before surgery (unless your health care provider gives you different instructions): 1. Using the CHG cloth, vigorously scrub the part of your body where you will be having surgery. Scrub using a back-and-forth motion for 3 minutes. The area on your body should be completely  wet with CHG when you are done scrubbing. 2. Do not rinse. Discard the cloth and let the area air-dry. Do not put any substances on the area afterward, such as powder, lotion, or perfume. 3. Put on clean clothes or pajamas. 4. If it is the night before your surgery, sleep in clean sheets.  For general bathing Follow these steps when using CHG cloths for general bathing (unless your health care provider gives you different  instructions). 1. Use a separate CHG cloth for each area of your body. Make sure you wash between any folds of skin and between your fingers and toes. Wash your body in the following order, switching to a new cloth after each step: ? The front of your neck, shoulders, and chest. ? Both of your arms, under your arms, and your hands. ? Your stomach and groin area, avoiding the genitals. ? Your right leg and foot. ? Your left leg and foot. ? The back of your neck, your back, and your buttocks. 2. Do not rinse. Discard the cloth and let the area air-dry. Do not put any substances on your body afterward-such as powder, lotion, or perfume-unless you are told to do so by your health care provider. Only use lotions that are recommended by the manufacturer. 3. Put on clean clothes or pajamas. Contact a health care provider if:  Your skin gets irritated after scrubbing.  You have questions about using your solution or cloth. Get help right away if:  Your eyes become very red or swollen.  Your eyes itch badly.  Your skin itches badly and is red or swollen.  Your hearing changes.  You have trouble seeing.  You have swelling or tingling in your mouth or throat.  You have trouble breathing.  You swallow any chlorhexidine. Summary  Chlorhexidine gluconate (CHG) is a germ-killing (antiseptic) solution that is used to clean the skin. Cleaning your skin with CHG may help to lower your risk for infection.  You may be given CHG to use for bathing. It may be in a bottle or in a prepackaged cloth to use on your skin. Carefully follow your health care provider's instructions and the instructions on the product label.  Do not use CHG if you have a chlorhexidine allergy.  Contact your health care provider if your skin gets irritated after scrubbing. This information is not intended to replace advice given to you by your health care provider. Make sure you discuss any questions you have with your  health care provider. Document Released: 04/30/2012 Document Revised: 10/23/2018 Document Reviewed: 07/04/2017 Elsevier Patient Education  2020 [employer].

## 2019-06-10 ENCOUNTER — Ambulatory Visit (HOSPITAL_COMMUNITY): Payer: No Typology Code available for payment source | Admitting: Anesthesiology

## 2019-06-10 ENCOUNTER — Encounter (HOSPITAL_COMMUNITY): Payer: Self-pay | Admitting: *Deleted

## 2019-06-10 ENCOUNTER — Ambulatory Visit (HOSPITAL_COMMUNITY)
Admission: RE | Admit: 2019-06-10 | Discharge: 2019-06-10 | Disposition: A | Discharge status: Home / Self Care | Payer: No Typology Code available for payment source | Attending: General Surgery | Admitting: General Surgery

## 2019-06-10 ENCOUNTER — Encounter (HOSPITAL_COMMUNITY)
Admission: RE | Disposition: A | Discharge status: Home / Self Care | Payer: Self-pay | Source: Home / Self Care | Attending: General Surgery

## 2019-06-10 DIAGNOSIS — K429 Umbilical hernia without obstruction or gangrene: Secondary | ICD-10-CM | POA: Insufficient documentation

## 2019-06-10 HISTORY — PX: UMBILICAL HERNIA REPAIR: SHX196

## 2019-06-10 HISTORY — DX: Other seasonal allergic rhinitis: J30.2

## 2019-06-10 SURGERY — REPAIR, HERNIA, UMBILICAL, ADULT
Anesthesia: General | Site: Abdomen

## 2019-06-10 MED ORDER — BUPIVACAINE LIPOSOME 1.3 % IJ SUSP
INTRAMUSCULAR | Status: AC
  Filled 2019-06-10: qty 20

## 2019-06-10 MED ORDER — PROPOFOL 10 MG/ML IV BOLUS
INTRAVENOUS | Status: DC | PRN
  Administered 2019-06-10: 200 mg via INTRAVENOUS
  Administered 2019-06-10: 50 mg via INTRAVENOUS

## 2019-06-10 MED ORDER — BUPIVACAINE LIPOSOME 1.3 % IJ SUSP
INTRAMUSCULAR | Status: DC | PRN
  Administered 2019-06-10: 20 mL

## 2019-06-10 MED ORDER — PROMETHAZINE HCL 25 MG/ML IJ SOLN: 6.2500 mg | INTRAMUSCULAR | Status: DC | PRN

## 2019-06-10 MED ORDER — PHENYLEPHRINE 40 MCG/ML (10ML) SYRINGE FOR IV PUSH (FOR BLOOD PRESSURE SUPPORT)
PREFILLED_SYRINGE | INTRAVENOUS | Status: AC
  Filled 2019-06-10: qty 20

## 2019-06-10 MED ORDER — FENTANYL CITRATE (PF) 250 MCG/5ML IJ SOLN
INTRAMUSCULAR | Status: AC
  Filled 2019-06-10: qty 5

## 2019-06-10 MED ORDER — IPRATROPIUM-ALBUTEROL 0.5-2.5 (3) MG/3ML IN SOLN
3.0000 mL | RESPIRATORY_TRACT | Status: DC
  Administered 2019-06-10: 3 mL via RESPIRATORY_TRACT

## 2019-06-10 MED ORDER — ONDANSETRON HCL 4 MG/2ML IJ SOLN
INTRAMUSCULAR | Status: DC | PRN
  Administered 2019-06-10: 4 mg via INTRAVENOUS

## 2019-06-10 MED ORDER — LIDOCAINE HCL (CARDIAC) PF 100 MG/5ML IV SOSY
PREFILLED_SYRINGE | INTRAVENOUS | Status: DC | PRN
  Administered 2019-06-10: 50 mg via INTRAVENOUS

## 2019-06-10 MED ORDER — OXYCODONE-ACETAMINOPHEN 7.5-325 MG PO TABS: 1.0000 | ORAL_TABLET | Freq: Four times a day (QID) | ORAL | 0 refills | Status: AC | PRN

## 2019-06-10 MED ORDER — SUGAMMADEX SODIUM 500 MG/5ML IV SOLN
INTRAVENOUS | Status: DC | PRN
  Administered 2019-06-10: 300 mg via INTRAVENOUS

## 2019-06-10 MED ORDER — 0.9 % SODIUM CHLORIDE (POUR BTL) OPTIME
TOPICAL | Status: DC | PRN
  Administered 2019-06-10: 10:00:00 1000 mL

## 2019-06-10 MED ORDER — MIDAZOLAM HCL 2 MG/2ML IJ SOLN: 0.5000 mg | Freq: Once | INTRAMUSCULAR | Status: DC | PRN

## 2019-06-10 MED ORDER — MIDAZOLAM HCL 5 MG/5ML IJ SOLN
INTRAMUSCULAR | Status: DC | PRN
  Administered 2019-06-10: 2 mg via INTRAVENOUS

## 2019-06-10 MED ORDER — MIDAZOLAM HCL 2 MG/2ML IJ SOLN
INTRAMUSCULAR | Status: AC
  Filled 2019-06-10: qty 2

## 2019-06-10 MED ORDER — IPRATROPIUM-ALBUTEROL 0.5-2.5 (3) MG/3ML IN SOLN
RESPIRATORY_TRACT | Status: AC
  Filled 2019-06-10: qty 3

## 2019-06-10 MED ORDER — EPHEDRINE 5 MG/ML INJ
INTRAVENOUS | Status: AC
  Filled 2019-06-10: qty 10

## 2019-06-10 MED ORDER — FENTANYL CITRATE (PF) 100 MCG/2ML IJ SOLN
INTRAMUSCULAR | Status: DC | PRN
  Administered 2019-06-10: 50 ug via INTRAVENOUS
  Administered 2019-06-10 (×2): 100 ug via INTRAVENOUS

## 2019-06-10 MED ORDER — ROCURONIUM BROMIDE 10 MG/ML (PF) SYRINGE
PREFILLED_SYRINGE | INTRAVENOUS | Status: AC
  Filled 2019-06-10: qty 10

## 2019-06-10 MED ORDER — CEFAZOLIN SODIUM-DEXTROSE 2-4 GM/100ML-% IV SOLN
2.0000 g | INTRAVENOUS | Status: AC
  Administered 2019-06-10: 09:00:00 2 g via INTRAVENOUS
  Filled 2019-06-10: qty 100

## 2019-06-10 MED ORDER — KETOROLAC TROMETHAMINE 30 MG/ML IJ SOLN
30.0000 mg | Freq: Once | INTRAMUSCULAR | Status: AC
  Administered 2019-06-10: 30 mg via INTRAVENOUS
  Filled 2019-06-10: qty 1

## 2019-06-10 MED ORDER — CHLORHEXIDINE GLUCONATE CLOTH 2 % EX PADS: 6.0000 | MEDICATED_PAD | Freq: Once | CUTANEOUS | Status: DC

## 2019-06-10 MED ORDER — ONDANSETRON HCL 4 MG/2ML IJ SOLN
INTRAMUSCULAR | Status: AC
  Filled 2019-06-10: qty 2

## 2019-06-10 MED ORDER — LACTATED RINGERS IV SOLN
INTRAVENOUS | Status: DC
  Administered 2019-06-10: 1000 mL via INTRAVENOUS

## 2019-06-10 MED ORDER — ROCURONIUM BROMIDE 100 MG/10ML IV SOLN
INTRAVENOUS | Status: DC | PRN
  Administered 2019-06-10: 50 mg via INTRAVENOUS

## 2019-06-10 MED ORDER — HYDROMORPHONE HCL 1 MG/ML IJ SOLN: 0.2500 mg | INTRAMUSCULAR | Status: DC | PRN

## 2019-06-10 MED ORDER — SUCCINYLCHOLINE CHLORIDE 200 MG/10ML IV SOSY
PREFILLED_SYRINGE | INTRAVENOUS | Status: AC
  Filled 2019-06-10: qty 10

## 2019-06-10 MED ORDER — SUCCINYLCHOLINE CHLORIDE 200 MG/10ML IV SOSY
PREFILLED_SYRINGE | INTRAVENOUS | Status: DC | PRN
  Administered 2019-06-10: 20 mg via INTRAVENOUS

## 2019-06-10 MED ORDER — HYDROCODONE-ACETAMINOPHEN 7.5-325 MG PO TABS: 1.0000 | ORAL_TABLET | Freq: Once | ORAL | Status: DC | PRN

## 2019-06-10 MED ORDER — SUGAMMADEX SODIUM 500 MG/5ML IV SOLN
INTRAVENOUS | Status: AC
  Filled 2019-06-10: qty 5

## 2019-06-10 MED ORDER — PROPOFOL 10 MG/ML IV BOLUS
INTRAVENOUS | Status: AC
  Filled 2019-06-10: qty 40

## 2019-06-10 MED ORDER — LIDOCAINE 2% (20 MG/ML) 5 ML SYRINGE
INTRAMUSCULAR | Status: AC
  Filled 2019-06-10: qty 5

## 2019-06-10 SURGICAL SUPPLY — 36 items
ADH SKN CLS APL DERMABOND .7 (GAUZE/BANDAGES/DRESSINGS) ×1
APL PRP STRL LF DISP 70% ISPRP (MISCELLANEOUS) ×1
BLADE SURG SZ11 CARB STEEL (BLADE) ×2 IMPLANT
CHLORAPREP W/TINT 26 (MISCELLANEOUS) ×2 IMPLANT
CLOTH BEACON ORANGE TIMEOUT ST (SAFETY) ×2 IMPLANT
COVER LIGHT HANDLE STERIS (MISCELLANEOUS) ×4 IMPLANT
COVER WAND RF STERILE (DRAPES) ×2 IMPLANT
DERMABOND ADVANCED (GAUZE/BANDAGES/DRESSINGS) ×1
DERMABOND ADVANCED .7 DNX12 (GAUZE/BANDAGES/DRESSINGS) ×1 IMPLANT
ELECT REM PT RETURN 9FT ADLT (ELECTROSURGICAL) ×2
ELECTRODE REM PT RTRN 9FT ADLT (ELECTROSURGICAL) ×1 IMPLANT
GLOVE BIO SURGEON STRL SZ 6.5 (GLOVE) ×1 IMPLANT
GLOVE BIOGEL PI IND STRL 7.0 (GLOVE) ×2 IMPLANT
GLOVE BIOGEL PI INDICATOR 7.0 (GLOVE) ×3
GLOVE SURG SS PI 7.5 STRL IVOR (GLOVE) ×2 IMPLANT
GOWN STRL REUS W/ TWL LRG LVL3 (GOWN DISPOSABLE) IMPLANT
GOWN STRL REUS W/TWL LRG LVL3 (GOWN DISPOSABLE) ×6 IMPLANT
INST SET MINOR GENERAL (KITS) ×2 IMPLANT
KIT TURNOVER KIT A (KITS) ×2 IMPLANT
LIGASURE IMPACT 36 18CM CVD LR (INSTRUMENTS) ×1 IMPLANT
MANIFOLD NEPTUNE II (INSTRUMENTS) ×2 IMPLANT
MESH VENTRALEX ST 1-7/10 CRC S (Mesh General) ×1 IMPLANT
NDL HYPO 18GX1.5 BLUNT FILL (NEEDLE) ×1 IMPLANT
NDL HYPO 21X1.5 SAFETY (NEEDLE) ×1 IMPLANT
NEEDLE HYPO 18GX1.5 BLUNT FILL (NEEDLE) ×2 IMPLANT
NEEDLE HYPO 21X1.5 SAFETY (NEEDLE) ×2 IMPLANT
NS IRRIG 1000ML POUR BTL (IV SOLUTION) ×2 IMPLANT
PACK MINOR (CUSTOM PROCEDURE TRAY) ×2 IMPLANT
PAD ARMBOARD 7.5X6 YLW CONV (MISCELLANEOUS) ×2 IMPLANT
SET BASIN LINEN APH (SET/KITS/TRAYS/PACK) ×2 IMPLANT
SUT ETHIBOND NAB MO 7 #0 18IN (SUTURE) ×2 IMPLANT
SUT MNCRL AB 4-0 PS2 18 (SUTURE) ×2 IMPLANT
SUT VIC AB 2-0 CT2 27 (SUTURE) ×2 IMPLANT
SUT VIC AB 3-0 SH 27 (SUTURE) ×2
SUT VIC AB 3-0 SH 27X BRD (SUTURE) ×1 IMPLANT
SYR 20ML LL LF (SYRINGE) ×4 IMPLANT

## 2019-06-10 NOTE — Op Note (Signed)
Patient:  Adam Byrd  DOB:  10/14/1990  MRN:  [id number]   Preop Diagnosis: Umbilical hernia  Postop Diagnosis: Same  Procedure: Umbilical herniorrhaphy with mesh  Surgeon: Aviva Signs, MD  Anes: General  Indications: Patient is a 28 year old black male who presents with a symptomatic umbilical hernia.  The risks and benefits of the procedure including bleeding, infection, mesh use, and the possibility of recurrence of the hernia were fully explained to the patient, who gave informed consent.  Procedure note: The patient was placed in the supine position.  After general anesthesia was administered, the abdomen was prepped and draped using the usual sterile technique with ChloraPrep.  Surgical site confirmation was performed.  An infraumbilical incision was made down to the fascia.  The umbilicus was retracted superiorly.  The patient had a hernia sac with omentum present at the base of the umbilicus.  The hernia sac was excised.  In order to facilitate reduction, the omentum was excised using the LigaSure.  It was disposed of.  The hernia defect was approximately 1.5 cm in greatest diameter.  A Bard 4.7 Ventralax ST patch was then inserted and secured to the fascia using 0 Ethibond interrupted sutures.  The overlying fascia was reapproximated transversely using 0 Ethibond interrupted sutures.  Subcutaneous layer was reapproximated using 3-0 Vicryl interrupted suture.  Exparel was instilled into the surrounding wound.  The skin was closed using a 4-0 Monocryl subcuticular suture.  Dermabond was applied.  All tape and needle counts were correct at the end of the procedure.  The patient was awakened and transferred to PACU in stable condition.  Complications: None  EBL: Minimal  Specimen: None

## 2019-06-10 NOTE — Anesthesia Procedure Notes (Signed)
Procedure Name: Intubation Date/Time: 06/10/2019 9:13 AM Performed by: Jonna Munro, CRNA Pre-anesthesia Checklist: Patient identified, Emergency Drugs available, Suction available, Patient being monitored and Timeout performed Patient Re-evaluated:Patient Re-evaluated prior to induction Oxygen Delivery Method: Circle system utilized Preoxygenation: Pre-oxygenation with 100% oxygen Induction Type: IV induction, Rapid sequence and Cricoid Pressure applied Laryngoscope Size: Mac and 4 Grade View: Grade I Tube type: Oral Tube size: 7.5 mm Number of attempts: 1 Airway Equipment and Method: Stylet Placement Confirmation: ETT inserted through vocal cords under direct vision,  positive ETCO2 and breath sounds checked- equal and bilateral Secured at: 22 cm Tube secured with: Tape Dental Injury: Teeth and Oropharynx as per pre-operative assessment

## 2019-06-10 NOTE — Interval H&P Note (Signed)
History and Physical Interval Note:  06/10/2019 8:40 AM  Adam Byrd  has presented today for surgery, with the diagnosis of UMBILICAL HERNIA.  The various methods of treatment have been discussed with the patient and family. After consideration of risks, benefits and other options for treatment, the patient has consented to  Procedure(s): HERNIA REPAIR UMBILICAL ADULT WITH MESH (N/A) as a surgical intervention.  The patient's history has been reviewed, patient examined, no change in status, stable for surgery.  I have reviewed the patient's chart and labs.  Questions were answered to the patient's satisfaction.     Aviva Signs

## 2019-06-10 NOTE — Transfer of Care (Signed)
Immediate Anesthesia Transfer of Care Note  Patient: Adam Byrd  Procedure(s) Performed: HERNIA REPAIR UMBILICAL ADULT WITH MESH (N/A Abdomen)  Patient Location: PACU  Anesthesia Type:General  Level of Consciousness: awake, alert , sedated and patient cooperative  Airway & Oxygen Therapy: Patient Spontanous Breathing and Patient connected to face mask oxygen  Post-op Assessment: Report given to RN, Post -op Vital signs reviewed and stable and Patient moving all extremities X 4  Post vital signs: Reviewed and stable  Last Vitals:  Vitals Value Taken Time  BP 172/101 06/10/19 1030  Temp    Pulse 77 06/10/19 1034  Resp 32 06/10/19 1036  SpO2 82 % 06/10/19 1034  Vitals shown include unvalidated device data.  Last Pain:  Vitals:   06/10/19 0755  TempSrc: Oral  PainSc: 0-No pain      Patients Stated Pain Goal: 5 (83/41/96 [id number])  Complications: No apparent anesthesia complications

## 2019-06-10 NOTE — Discharge Instructions (Signed)
Open Hernia Repair, Adult, Care After °This sheet gives you information about how to care for yourself after your procedure. Your health care provider may also give you more specific instructions. If you have problems or questions, contact your health care provider. °What can I expect after the procedure? °After the procedure, it is common to have: °· Mild discomfort. °· Slight bruising. °· Minor swelling. °· Pain in the abdomen. °Follow these instructions at home: °Incision care ° °· Follow instructions from your health care provider about how to take care of your incision area. Make sure you: °? Wash your hands with soap and water before you change your bandage (dressing). If soap and water are not available, use hand sanitizer. °? Change your dressing as told by your health care provider. °? Leave stitches (sutures), skin glue, or adhesive strips in place. These skin closures may need to stay in place for 2 weeks or longer. If adhesive strip edges start to loosen and curl up, you may trim the loose edges. Do not remove adhesive strips completely unless your health care provider tells you to do that. °· Check your incision area every day for signs of infection. Check for: °? More redness, swelling, or pain. °? More fluid or blood. °? Warmth. °? Pus or a bad smell. °Activity °· Do not drive or use heavy machinery while taking prescription pain medicine. Do not drive until your health care provider approves. °· Until your health care provider approves: °? Do not lift anything that is heavier than 10 lb (4.5 kg). °? Do not play contact sports. °· Return to your normal activities as told by your health care provider. Ask your health care provider what activities are safe. °General instructions °· To prevent or treat constipation while you are taking prescription pain medicine, your health care provider may recommend that you: °? Drink enough fluid to keep your urine clear or pale yellow. °? Take over-the-counter or  prescription medicines. °? Eat foods that are high in fiber, such as fresh fruits and vegetables, whole grains, and beans. °? Limit foods that are high in fat and processed sugars, such as fried and sweet foods. °· Take over-the-counter and prescription medicines only as told by your health care provider. °· Do not take tub baths or go swimming until your health care provider approves. °· Keep all follow-up visits as told by your health care provider. This is important. °Contact a health care provider if: °· You develop a rash. °· You have more redness, swelling, or pain around your incision. °· You have more fluid or blood coming from your incision. °· Your incision feels warm to the touch. °· You have pus or a bad smell coming from your incision. °· You have a fever or chills. °· You have blood in your stool (feces). °· You have not had a bowel movement in 2-3 days. °· Your pain is not controlled with medicine. °Get help right away if: °· You have chest pain or shortness of breath. °· You feel light-headed or feel faint. °· You have severe pain. °· You vomit and your pain is worse. °This information is not intended to replace advice given to you by your health care provider. Make sure you discuss any questions you have with your health care provider. °Document Released: 02/23/2005 Document Revised: 07/19/2017 Document Reviewed: 01/18/2016 °Elsevier Patient Education © 2020 Elsevier Inc[employer] ° °

## 2019-06-10 NOTE — Anesthesia Postprocedure Evaluation (Signed)
Anesthesia Post Note  Patient: Adam Byrd  Procedure(s) Performed: HERNIA REPAIR UMBILICAL ADULT WITH MESH (N/A Abdomen)  Patient location during evaluation: PACU Anesthesia Type: General Level of consciousness: awake, patient cooperative and sedated Pain management: pain level controlled Vital Signs Assessment: post-procedure vital signs reviewed and stable Respiratory status: spontaneous breathing, respiratory function stable and nonlabored ventilation Cardiovascular status: stable Postop Assessment: no apparent nausea or vomiting Anesthetic complications: no     Last Vitals:  Vitals:   06/10/19 0755  Pulse: (!) 58  Resp: 20  Temp: 36.7 C  SpO2: 99%    Last Pain:  Vitals:   06/10/19 0755  TempSrc: Oral  PainSc: 0-No pain                 Rashida Ladouceur

## 2019-06-10 NOTE — Anesthesia Preprocedure Evaluation (Signed)
Anesthesia Evaluation  Patient identified by MRN, date of birth, ID band Patient awake  General Assessment Comment:Reports grandfather died under anesthesia  Could provide no other information when questioned  Denied being told family members had issues with anesthesia   Pt appear aloof and disinterested in answering questions  Had a difficult time keeping eye contact, only interested in his phone   Denies any current Meds or known issues other than smoking and pneumonia in 2020. No f/u CXR taken   Reviewed: Allergy & Precautions, NPO status , Patient's Chart, lab work & pertinent test results  Airway Mallampati: I  TM Distance: >3 FB Neck ROM: Full    Dental no notable dental hx. (+) Teeth Intact   Pulmonary pneumonia, resolved, Current SmokerPatient did not abstain from smoking.,  Pt withknown pneumonia in 09/2018 Treated  Continues to smoke  Denies smoking today, but has strong smell of cigarettes    Pulmonary exam normal breath sounds clear to auscultation       Cardiovascular Exercise Tolerance: Good negative cardio ROS Normal cardiovascular examI Rhythm:Regular Rate:Normal     Neuro/Psych negative neurological ROS  negative psych ROS   GI/Hepatic negative GI ROS, Neg liver ROS,   Endo/Other  negative endocrine ROS  Renal/GU negative Renal ROS  negative genitourinary   Musculoskeletal negative musculoskeletal ROS (+)   Abdominal   Peds negative pediatric ROS (+)  Hematology negative hematology ROS (+)   Anesthesia Other Findings   Reproductive/Obstetrics negative OB ROS                             Anesthesia Physical Anesthesia Plan  ASA: II  Anesthesia Plan: General   Post-op Pain Management:    Induction: Intravenous  PONV Risk Score and Plan: 2 and Midazolam, Ondansetron and Treatment may vary due to age or medical condition  Airway Management Planned: LMA and Oral  ETT  Additional Equipment:   Intra-op Plan:   Post-operative Plan: Extubation in OR  Informed Consent: I have reviewed the patients History and Physical, chart, labs and discussed the procedure including the risks, benefits and alternatives for the proposed anesthesia with the patient or authorized representative who has indicated his/her understanding and acceptance.     Dental advisory given  Plan Discussed with: CRNA  Anesthesia Plan Comments: (Plan Full PPE use Plan GA(LMA) vs. GETA D/W PT -WTP with same after Q&A)        Anesthesia Quick Evaluation

## 2019-06-11 ENCOUNTER — Encounter (HOSPITAL_COMMUNITY): Payer: Self-pay | Admitting: General Surgery

## 2019-06-19 ENCOUNTER — Telehealth: Payer: Self-pay | Admitting: General Surgery

## 2019-06-19 ENCOUNTER — Telehealth (INDEPENDENT_AMBULATORY_CARE_PROVIDER_SITE_OTHER): Payer: Self-pay | Admitting: General Surgery

## 2019-06-19 ENCOUNTER — Other Ambulatory Visit: Payer: Self-pay

## 2019-06-19 DIAGNOSIS — Z09 Encounter for follow-up examination after completed treatment for conditions other than malignant neoplasm: Secondary | ICD-10-CM

## 2019-06-19 NOTE — Telephone Encounter (Signed)
Telephone postoperative visit performed.  Patient states he has had some incisional pain, but it is decreasing.  He did have a small knot to the left of the umbilicus.  He states this is also going away.  I told him to follow-up in my office next week should he have any concerns.  He was fine with that.  Follow-up as needed.

## 2019-08-10 IMAGING — DX DG CHEST 2V
2 series · 2 of 2 positions shown · non-contrast
Comparison: None.

CLINICAL DATA: Hemoptysis.

EXAM:
CHEST - 2 VIEW

[chest pa]
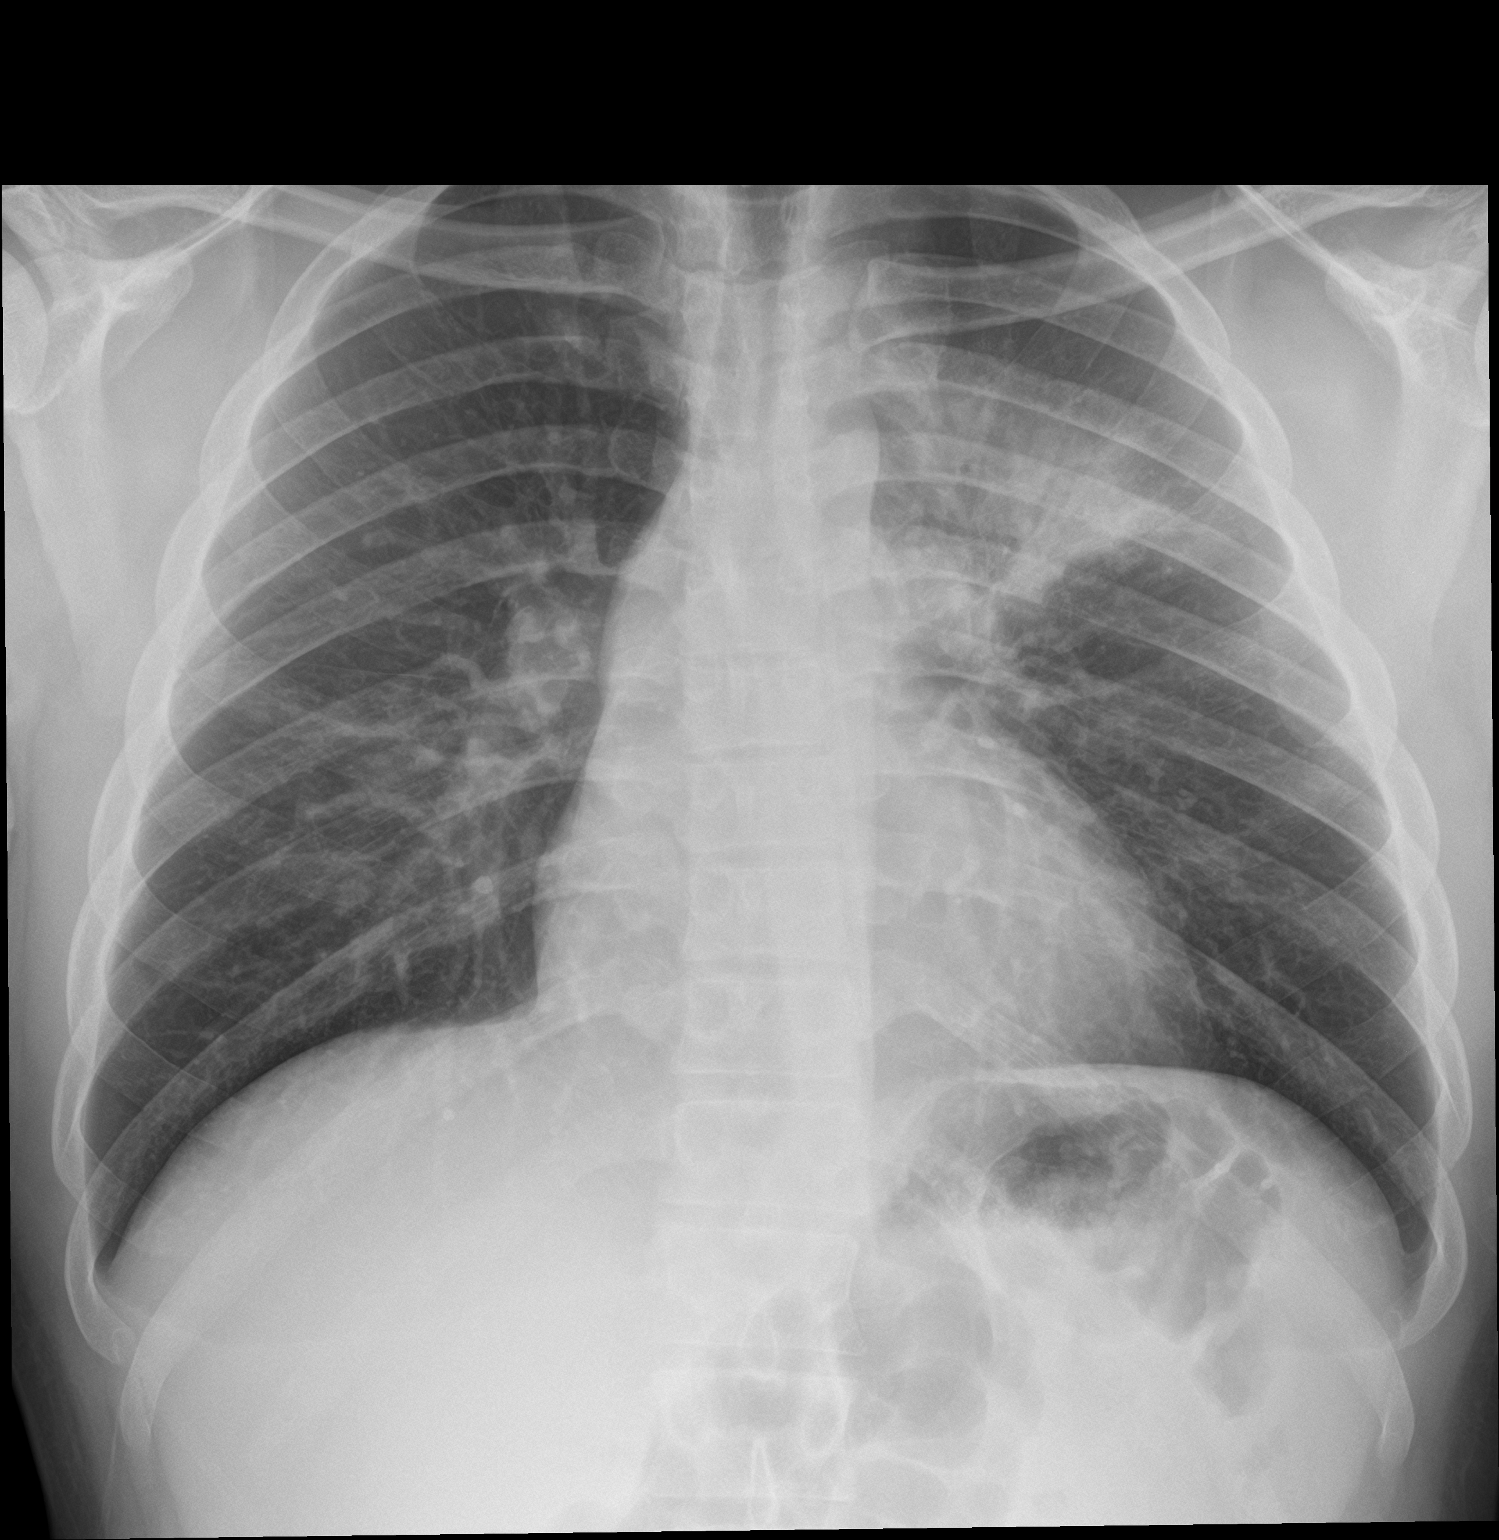

[chest lat]
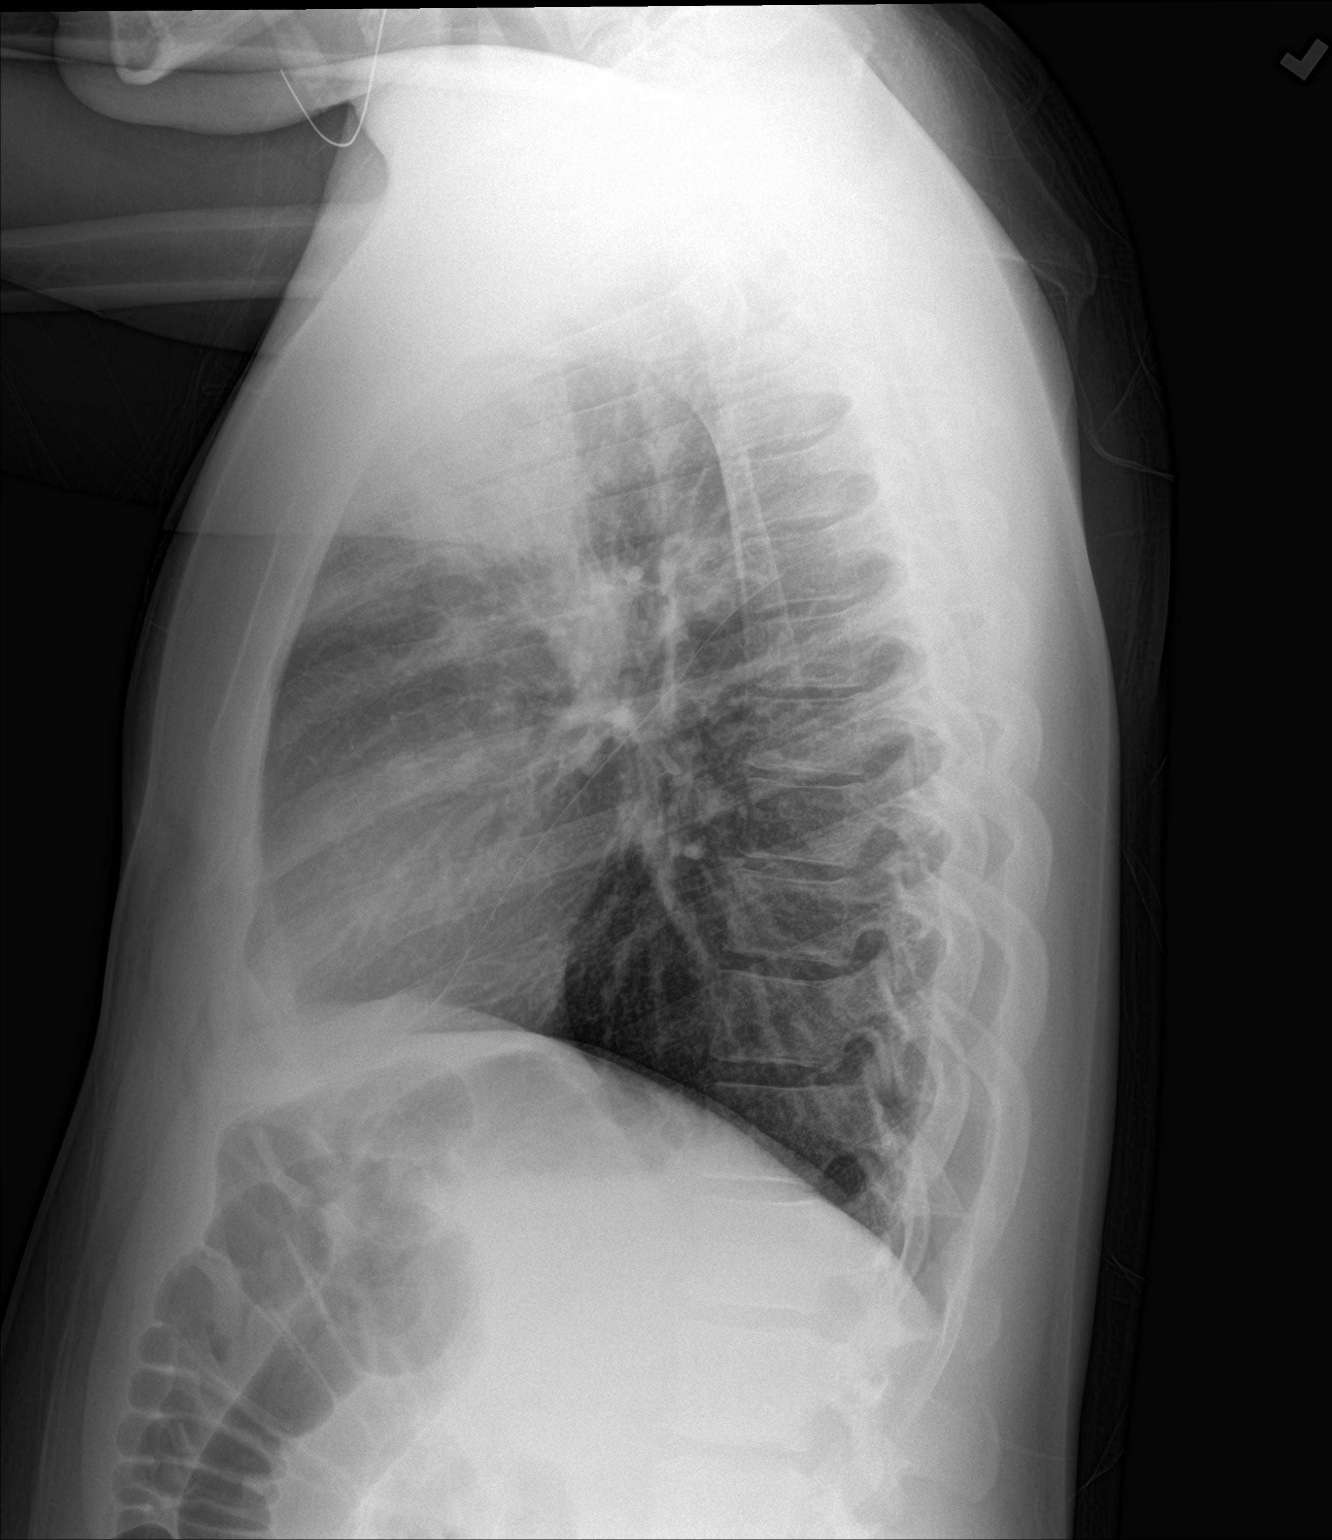

[2 of 2 positions shown; findings below may reference images not displayed]

FINDINGS: There is an infiltrate in the left upper lobe. The heart, hila, and
mediastinum are normal. No pneumothorax. The lungs are otherwise
clear.
IMPRESSION: There is infiltrate in the left upper lobe. This could represent
pneumonia or aspiration. Recommend treatment with short-term
follow-up chest x-ray to ensure resolution.

## 2021-10-15 ENCOUNTER — Emergency Department (HOSPITAL_COMMUNITY)
Admission: EM | Admit: 2021-10-15 | Discharge: 2021-10-15 | Disposition: A | Discharge status: Home / Self Care | Payer: BC Managed Care – PPO | Attending: Emergency Medicine | Admitting: Emergency Medicine

## 2021-10-15 ENCOUNTER — Emergency Department (HOSPITAL_COMMUNITY): Payer: BC Managed Care – PPO

## 2021-10-15 ENCOUNTER — Other Ambulatory Visit: Payer: Self-pay

## 2021-10-15 ENCOUNTER — Encounter (HOSPITAL_COMMUNITY): Payer: Self-pay | Admitting: Emergency Medicine

## 2021-10-15 DIAGNOSIS — M79672 Pain in left foot: Secondary | ICD-10-CM | POA: Diagnosis present

## 2021-10-15 DIAGNOSIS — M25572 Pain in left ankle and joints of left foot: Secondary | ICD-10-CM | POA: Insufficient documentation

## 2021-10-15 NOTE — Discharge Instructions (Addendum)
Imaging did not reveal any fracture dislocations which is good news.  Please continue taking ibuprofen.  You can take 600 mg every 6 hours for pain control.  Continue using the Ortho shoe for comfort.  Also given you follow-up with podiatry in the event this does not get better.  Please return to the emergency department for worsening symptoms.
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
## 2021-10-21 ENCOUNTER — Encounter (HOSPITAL_COMMUNITY): Payer: Self-pay

## 2021-10-21 ENCOUNTER — Other Ambulatory Visit: Payer: Self-pay

## 2021-10-21 ENCOUNTER — Emergency Department (HOSPITAL_COMMUNITY)
Admission: EM | Admit: 2021-10-21 | Discharge: 2021-10-21 | Disposition: A | Discharge status: Home / Self Care | Payer: BC Managed Care – PPO | Attending: Emergency Medicine | Admitting: Emergency Medicine

## 2021-10-21 DIAGNOSIS — S9032XD Contusion of left foot, subsequent encounter: Secondary | ICD-10-CM | POA: Insufficient documentation

## 2021-10-21 DIAGNOSIS — M7989 Other specified soft tissue disorders: Secondary | ICD-10-CM | POA: Insufficient documentation

## 2021-10-21 DIAGNOSIS — S9032XA Contusion of left foot, initial encounter: Secondary | ICD-10-CM
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
## 2021-10-21 NOTE — ED Triage Notes (Signed)
Patient states that he was seen in ED and given work note for one day but did not go to work all week and wants excuse note to cover him.  ?

## 2021-10-21 NOTE — Discharge Instructions (Signed)
I have extended your work note for 48 hours, you will need to see a family doctor for ongoing work notes ? ?[facility] Doctor List ? ? ? ?Syliva Overman, MD. Specialty: Family Medicine Contact information: [address]  ?[place] [address]  ?[phone]  ? ?Lilyan Punt, MD. Specialty: Family Medicine Contact information: [address]  ?[address]  ?[place] [address]  ?[phone]  ? ?Avon Gully, MD Specialty: Internal Medicine Contact information: [phone]  ?[place] [address]  ?[phone]  ? ?Catalina Pizza, MD. Specialty: Internal Medicine Contact information: [address]  ?[place] [address]  ?[phone]  ? ? [facility] (Dr. Selena Batten) Specialty: Family Medicine Contact information: [phone]  ?[place] [address]  ?[phone]  ? ?John Giovanni, MD. Specialty: Family Medicine Contact information: [phone]  ?[address]  ?[place] [address]  ?[phone]  ? ?Carylon Perches, MD. Specialty: Internal Medicine Contact information: [phone]  ?[address]  ?[place] [address]  ?[phone]  ? ? ?[facility] ? ?[address] ?[place], [place] [address] ?[phone] ? ?Services ?[facility] offers a variety of basic health services. ? ?Services include but are not limited to: ?Blood pressure checks  ?Heart rate checks  ?Blood sugar checks  ?Urine analysis  ?Rapid strep tests  ?Pregnancy tests.  ?Health education and referrals ? ?People needing more complex services will be directed to a physician online. Using these virtual visits, doctors can evaluate and prescribe medicine and treatments. ?There will be no medication on-site, though [facility] will help patients fill their prescriptions at little to no cost. ? ? ?For More information please go to: ?[email or web address] ? ?
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
